# Patient Record
Sex: Male | Born: 1985 | Race: Black or African American | Hispanic: No | Marital: Single | Smoking: Current some day smoker
Health system: Southern US, Community
[De-identification: ages and names within clinical notes are randomized; demographics above are authoritative.]

## PROBLEM LIST (undated history)

## (undated) HISTORY — PX: OTHER SURGICAL HISTORY: SHX169

---

## 2005-03-29 ENCOUNTER — Emergency Department: Payer: Self-pay | Admitting: Emergency Medicine
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
## 2006-04-27 ENCOUNTER — Emergency Department: Payer: Self-pay | Admitting: Emergency Medicine

## 2006-11-11 IMAGING — CR LEFT MIDDLE FINGER 2+V
1 series · 3 of 3 positions shown · non-contrast
Comparison: none

REASON FOR EXAM: crush injury   Minor Care # 3
COMMENTS:  LMP: (Male)

PROCEDURE:     DXR - DXR FINGER MID 3RD DIGIT LT HAND  - April 27, 2006  [DATE]
RESULT:     Views of the LEFT third finger reveal a fracture through the mid
shaft of the distal phalanx. There is angulation at the fracture site.
There is disruption of the overlying soft tissues.

[Series 1: view not recorded · 0.17mm/px · 3 of 3 slices shown]
[im 1/3]
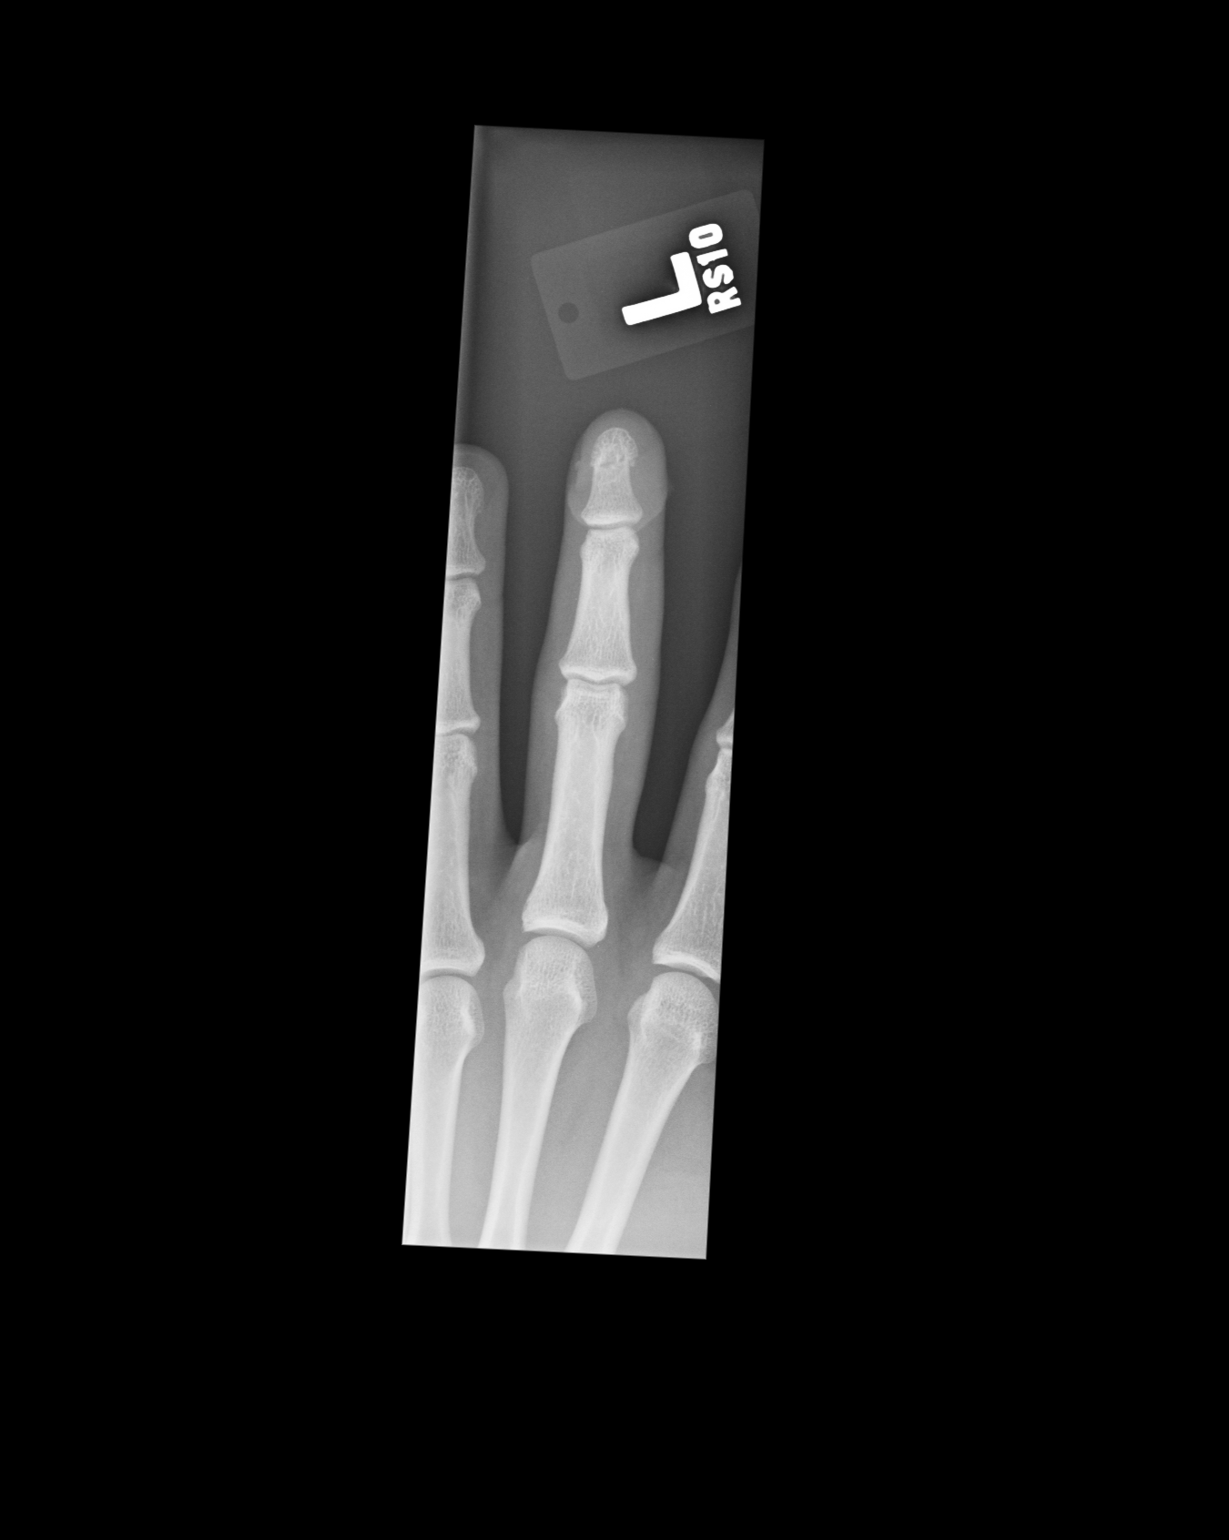
[im 2/3]
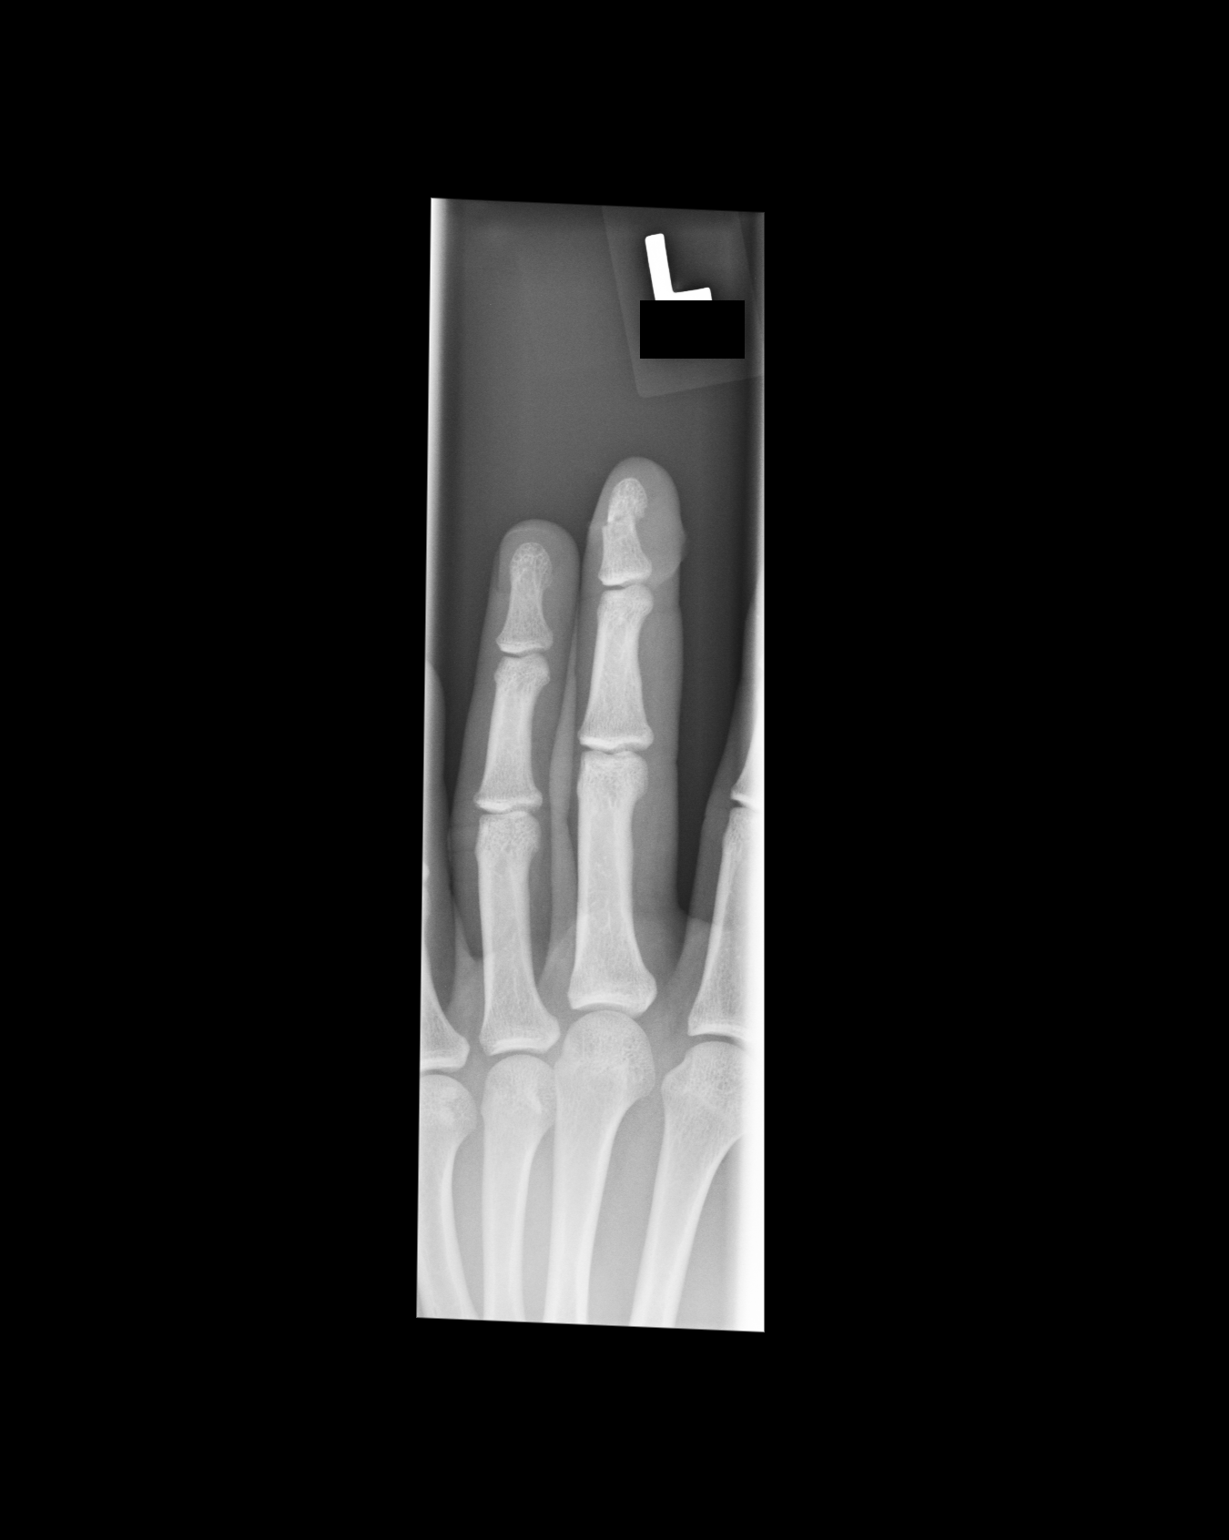
[im 3/3]
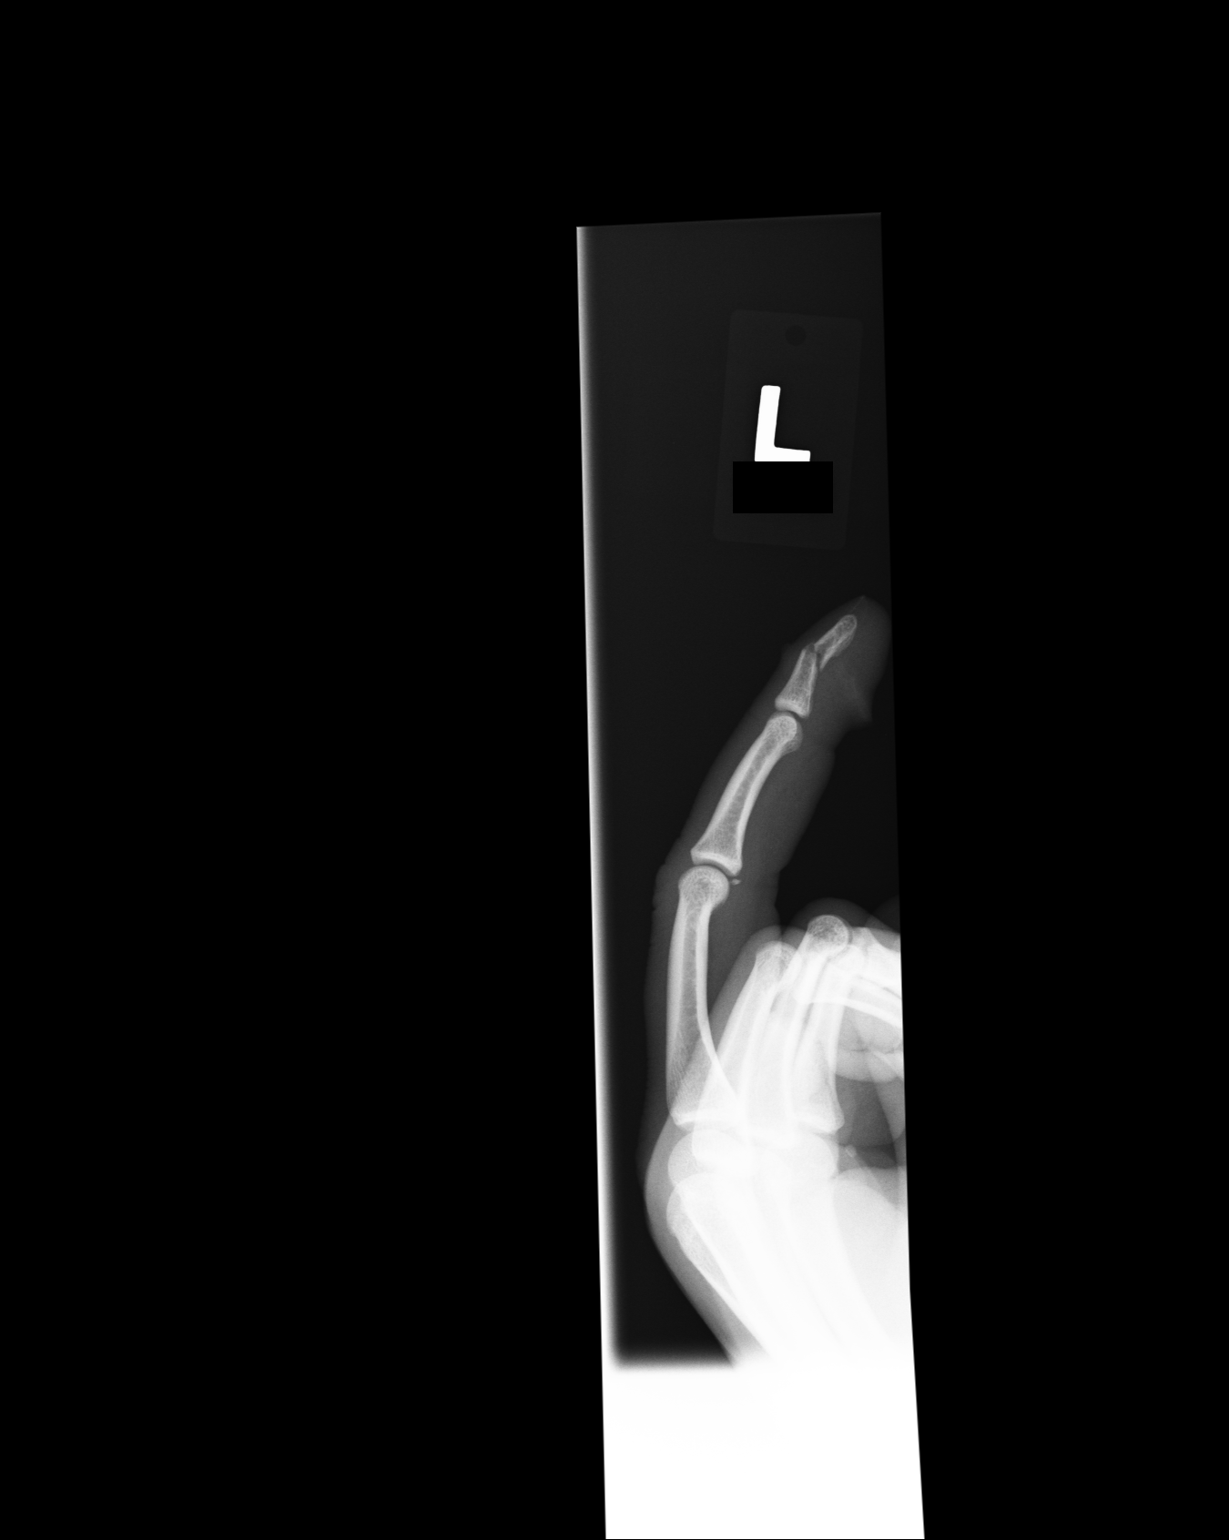

[3 of 3 positions shown; findings below may reference images not displayed]

IMPRESSION: 1)The patient has sustained a fracture through the mid shaft of the distal
phalanx of the LEFT third or long finger.

## 2016-08-27 ENCOUNTER — Ambulatory Visit (INDEPENDENT_AMBULATORY_CARE_PROVIDER_SITE_OTHER): Payer: Self-pay | Admitting: Podiatry

## 2016-08-27 DIAGNOSIS — L603 Nail dystrophy: Secondary | ICD-10-CM

## 2016-08-27 DIAGNOSIS — B351 Tinea unguium: Secondary | ICD-10-CM

## 2016-08-27 NOTE — Progress Notes (Addendum)
   Subjective:    Patient ID: Kevin Estes, male    DOB: [birth year], 30 y.o.   MRN: [id number]  HPI  30 year old male presents the also concerns of fungus to all of his toenails this been ongoing for quite some time. He is tried over-the-counter treatment any relief. He denies any pain toenails but as they do get long they cause pain in shoe gear. Denies any redness or drainage coming from the toenails. No other complaints today.  Review of Systems  All other systems reviewed and are negative.      Objective:   Physical Exam General: AAO x3, NAD  Dermatological: Nails are hypertrophic, dystrophic, brittle, discolored, elongated 10. No surrounding redness or drainage. No tenderness to the toenails.  Try, peeling, erythematous skin bilateral feet. No open lesions or pre-ulcerative lesions are identified today.  Vascular: Dorsalis Pedis artery and Posterior Tibial artery pedal pulses are 2/4 bilateral with immedate capillary fill time. Pedal hair growth present. No varicosities and no lower extremity edema present bilateral. There is no pain with calf compression, swelling, warmth, erythema.   Neruologic: Grossly intact via light touch bilateral. Vibratory intact via tuning fork bilateral. Protective threshold with Semmes Wienstein monofilament intact to all pedal sites bilateral.   Musculoskeletal: No gross boney pedal deformities bilateral. No pain, crepitus, or limitation noted with foot and ankle range of motion bilateral. Muscular strength 5/5 in all groups tested bilateral.  Gait: Unassisted, Nonantalgic.      Assessment & Plan:  30 year old male with onychodystrophy likely onychomycosis; tinea pedis -Treatment options discussed including all alternatives, risks, and complications -Etiology of symptoms were discussed -Nails were debrided and [facility] -We will go ahead and start Lamisil given the skin infection as well as the nail fungus. Also him back  after nail cultures in 4 weeks. If onychomycosis and we decided to proceed with Lamisil we will continue this and check blood work due to long term use.  -Discussed treatment options and he will likely undergo Lamisil pending culture results. -Follow-up of the culture sooner if any. Call any questions concerns.  Ovid CurdMatthew Temperance Kelemen, DPM

## 2016-08-27 NOTE — Patient Instructions (Signed)

## 2016-08-31 MED ORDER — TERBINAFINE HCL 250 MG PO TABS: 250.0000 mg | ORAL_TABLET | Freq: Every day | ORAL | 0 refills | Status: DC

## 2016-09-01 ENCOUNTER — Telehealth: Payer: Self-pay | Admitting: *Deleted

## 2016-09-01 NOTE — Telephone Encounter (Signed)
Called and left the patient a message stating that I called the lamisil in to the [facility] on [address] in [place] and to call me back if he did not receive the medicine. Misty StanleyLisa

## 2016-09-24 ENCOUNTER — Ambulatory Visit (INDEPENDENT_AMBULATORY_CARE_PROVIDER_SITE_OTHER): Payer: Self-pay | Admitting: Podiatry

## 2016-09-24 ENCOUNTER — Ambulatory Visit: Payer: Self-pay | Admitting: Podiatry

## 2016-09-24 DIAGNOSIS — B351 Tinea unguium: Secondary | ICD-10-CM

## 2016-09-24 DIAGNOSIS — Z79899 Other long term (current) drug therapy: Secondary | ICD-10-CM

## 2016-09-24 MED ORDER — TERBINAFINE HCL 250 MG PO TABS: 250.0000 mg | ORAL_TABLET | Freq: Every day | ORAL | 0 refills | Status: DC

## 2016-09-24 NOTE — Patient Instructions (Signed)

## 2016-09-25 ENCOUNTER — Telehealth: Payer: Self-pay | Admitting: *Deleted

## 2016-09-25 LAB — CBC WITH DIFFERENTIAL/PLATELET
BASOS ABS: 0 10*3/uL (ref 0.0–0.2)
Basos: 0 %
EOS (ABSOLUTE): 0.1 10*3/uL (ref 0.0–0.4)
Eos: 2 %
Hematocrit: 41 % (ref 37.5–51.0)
Hemoglobin: 13.8 g/dL (ref 12.6–17.7)
IMMATURE GRANS (ABS): 0 10*3/uL (ref 0.0–0.1)
IMMATURE GRANULOCYTES: 0 %
LYMPHS: 41 %
Lymphocytes Absolute: 2.1 10*3/uL (ref 0.7–3.1)
MCH: 28.6 pg (ref 26.6–33.0)
MCHC: 33.7 g/dL (ref 31.5–35.7)
MCV: 85 fL (ref 79–97)
MONOCYTES: 12 %
Monocytes Absolute: 0.6 10*3/uL (ref 0.1–0.9)
NEUTROS ABS: 2.3 10*3/uL (ref 1.4–7.0)
NEUTROS PCT: 45 %
PLATELETS: 238 10*3/uL (ref 150–379)
RBC: 4.83 x10E6/uL (ref 4.14–5.80)
RDW: 13.7 % (ref 12.3–15.4)
WBC: 5.2 10*3/uL (ref 3.4–10.8)

## 2016-09-25 LAB — HEPATIC FUNCTION PANEL
ALBUMIN: 4.3 g/dL (ref 3.5–5.5)
ALT: 35 IU/L (ref 0–44)
AST: 32 IU/L (ref 0–40)
Alkaline Phosphatase: 67 IU/L (ref 39–117)
BILIRUBIN, DIRECT: 0.09 mg/dL (ref 0.00–0.40)
Bilirubin Total: 0.3 mg/dL (ref 0.0–1.2)
TOTAL PROTEIN: 7.2 g/dL (ref 6.0–8.5)

## 2016-09-25 NOTE — Telephone Encounter (Signed)
Pt states the terbinafine is not at the pharmacy. I told pt our office records show [facility]/24/[facility] at 11:36am. Pt states they just call and said the medication would be ready in 1 hour.

## 2016-09-29 ENCOUNTER — Ambulatory Visit: Payer: Self-pay

## 2016-09-29 DIAGNOSIS — B351 Tinea unguium: Secondary | ICD-10-CM

## 2016-09-29 NOTE — Progress Notes (Signed)
Pt presents with mycotic infection of nails 1-5 bilateral  All other systems are negative  Laser therapy administered to affected nails and tolerated well. All safety precautions were in place. Patient stated that he was currently taking lamisil and using a topical as well. Re-appointed in 1 month for 2nd of 6 treatments

## 2016-09-29 NOTE — Progress Notes (Signed)
Subjective: 30 year old male presents the also discussed nail culture results. He has been on Lamisil the last 4 weeks he denies any side effects. He states the skin is clearing up nicely since starting the medication. Denies any systemic complaints such as fevers, chills, nausea, vomiting. No acute changes since last appointment, and no other complaints at this time.   Objective: AAO x3, NAD DP/PT pulses palpable bilaterally, CRT less than 3 seconds Nails continue dystrophic, discolored, hypertrophic. There is no swelling redness or drainage or any tenderness the toenails. He pieces having quite a bit at tinea pedis and peeling of the skin which is resolved since starting the medication. Is no open lesions or pre-ulcerative lesions. No area pinpoint bony tenderness or pain the vibratory sensation. No pain with calf compression, swelling, warmth, erythema.  Assessment: 30 year old male onychomycosis   Plan: -Treatment options discussed including all alternatives, risks, and complications -Etiology of symptoms were discussed -Nail culture results were discussed the patient which should reveal onychomycosis. Discussed with him continuing Lamisil for another 2 months and this is refilled today as well as repeat blood work ordered. Also ordered compound cream for nail fungus through [facility].  -He would also like to proceed with lasering of the nails. Follow-up in the [place] office with lasering.   Ovid CurdMatthew Wagoner, DPM

## 2016-10-01 ENCOUNTER — Telehealth: Payer: Self-pay | Admitting: *Deleted

## 2016-10-01 NOTE — Telephone Encounter (Addendum)
-----   Message from Vivi BarrackMatthew R Wagoner, DPM sent at 09/26/2016  4:39 PM EDT ----- Blood work normal- can continue lamisil. 10/01/2016-Informed pt of Dr. Gabriel RungWagoner's orders.

## 2016-10-27 ENCOUNTER — Ambulatory Visit: Payer: Self-pay | Admitting: Podiatry

## 2016-10-27 DIAGNOSIS — B351 Tinea unguium: Secondary | ICD-10-CM

## 2016-10-27 NOTE — Telephone Encounter (Signed)
Error

## 2016-10-27 NOTE — Progress Notes (Signed)
Pt presents with mycotic infection of nails 1-5 bilateral  All other systems are negative  Laser therapy administered to affected nails and tolerated well. All safety precautions were in place. Patient stated that he was currently taking lamisil and using a topical as well. Re-appointed in 1 month for 3 of 6 treatments

## 2016-12-01 ENCOUNTER — Other Ambulatory Visit: Payer: Self-pay

## 2016-12-03 ENCOUNTER — Ambulatory Visit (INDEPENDENT_AMBULATORY_CARE_PROVIDER_SITE_OTHER): Payer: Self-pay

## 2016-12-03 DIAGNOSIS — B351 Tinea unguium: Secondary | ICD-10-CM

## 2016-12-03 DIAGNOSIS — L603 Nail dystrophy: Secondary | ICD-10-CM

## 2016-12-28 NOTE — Progress Notes (Signed)
Pt presents with mycotic infection of nails 1-5 bilateral  All other systems are negative  Laser therapy administered to affected nails and tolerated well. All safety precautions were in place. Patient stated that he was currently taking lamisil and using a topical as well. Re-appointed in 1 month for 4 of 6 treatments

## 2016-12-31 ENCOUNTER — Encounter: Payer: Self-pay | Admitting: Podiatry

## 2016-12-31 ENCOUNTER — Ambulatory Visit (INDEPENDENT_AMBULATORY_CARE_PROVIDER_SITE_OTHER): Payer: Self-pay | Admitting: Podiatry

## 2016-12-31 DIAGNOSIS — B351 Tinea unguium: Secondary | ICD-10-CM

## 2016-12-31 DIAGNOSIS — Z79899 Other long term (current) drug therapy: Secondary | ICD-10-CM

## 2016-12-31 MED ORDER — TERBINAFINE HCL 250 MG PO TABS: 250.0000 mg | ORAL_TABLET | Freq: Every day | ORAL | 1 refills | Status: DC

## 2016-12-31 NOTE — Progress Notes (Signed)
He presents today for follow-up of his onychomycosis. He states that this looks so much better than it did previously he's very excited. He is here for his laser today but also wanted to see me.  Objective: Vital signs are stable he is alert and oriented 3 denies any changes in his past medical history medications allergies are social history medication regimen. States that he had no problems taking the Lamisil.  Toenails appear to be healing quite nicely. The approximate 50% grown out.  Assessment: Long-term therapy associated with onychomycosis.  Plan: I recommended we continue the laser these nails as well as continue the Lamisil therapy 250 mg tablets 1 a day for the next 2 months. Follow-up with me in 3 months.

## 2017-01-27 ENCOUNTER — Other Ambulatory Visit: Payer: Self-pay

## 2017-02-09 ENCOUNTER — Ambulatory Visit: Payer: Self-pay

## 2017-02-09 DIAGNOSIS — B351 Tinea unguium: Secondary | ICD-10-CM

## 2017-02-23 ENCOUNTER — Ambulatory Visit (INDEPENDENT_AMBULATORY_CARE_PROVIDER_SITE_OTHER): Payer: Self-pay | Admitting: Podiatry

## 2017-02-23 DIAGNOSIS — B351 Tinea unguium: Secondary | ICD-10-CM

## 2017-02-24 NOTE — Progress Notes (Signed)
Pt presents with mycotic infection of nails 1-5 bilateral  All other systems are negative  Laser therapy administered to affected nails and tolerated well. All safety precautions were in place. Patient stated that he was currently taking lamisil and using a topical as well. Re-appointed in 1 month for 5 of 6 treatments

## 2017-02-25 NOTE — Progress Notes (Signed)
Pt presents with mycotic infection of nails 1-5 bilateral  All other systems are negative  Laser therapy administered to affected nails and tolerated well. All safety precautions were in place. Patient stated that he was currently taking lamisil and using a topical as well. Re-appointed in 2 months for 7th and final treatment

## 2017-04-29 ENCOUNTER — Ambulatory Visit: Payer: Self-pay

## 2017-04-29 DIAGNOSIS — B351 Tinea unguium: Secondary | ICD-10-CM

## 2017-05-11 NOTE — Progress Notes (Signed)
Pt presents with mycotic infection of nails 1-5 bilateral, cleared at 95%  All other systems are negative  Laser therapy administered to affected nails and tolerated well. All safety precautions were in place. Patient stated that he was currently taking lamisil and using a topical as well. Re-appointed prn

## 2017-09-14 ENCOUNTER — Ambulatory Visit (INDEPENDENT_AMBULATORY_CARE_PROVIDER_SITE_OTHER): Payer: Self-pay | Admitting: Urology

## 2017-09-14 ENCOUNTER — Encounter: Payer: Self-pay | Admitting: Urology

## 2017-09-14 VITALS — BP 125/67 | HR 120 | Ht 68.0 in | Wt 226.0 lb

## 2017-09-14 DIAGNOSIS — Z3009 Encounter for other general counseling and advice on contraception: Secondary | ICD-10-CM

## 2017-09-14 DIAGNOSIS — E281 Androgen excess: Secondary | ICD-10-CM

## 2017-09-14 MED ORDER — DIAZEPAM 10 MG PO TABS: 10.0000 mg | ORAL_TABLET | Freq: Once | ORAL | 0 refills | Status: AC

## 2017-09-14 NOTE — Progress Notes (Signed)
09/14/2017 3:27 PM   Kevin Estes 07/29/1986 [id number]  Referring provider: No referring provider defined for this encounter.  Chief Complaint  Patient presents with  . VAS Consult    New Patient    HPI: 31 year old male who presents today for further evaluation of possible vasectomy.  He is accompanied today to the office by his wife and daughter.  He was states that he has 4 biological children and desires no further pregnancies.  He and his wife was discussed this in detail.  No history of testicular injury or scrotal pain.  He does report today that he uses exogenous testosterone injections which he self manages through a gym.  He is a Pharmacist, communitybody builder.  He has not had labs recently.  He reports that he has a bodybuilding competition in 4 weeks and would like to defer vasectomy until after this time.   PMH: No past medical history on file.  Surgical History: Past Surgical History:  Procedure Laterality Date  . none      Home Medications:  Allergies as of 09/14/2017   No Known Allergies     Medication List       Accurate as of 09/14/17 11:59 PM. Always use your most recent med list.          diazepam 10 MG tablet Commonly known as:  VALIUM Take 1 tablet (10 mg total) by mouth once. Take 1 hour prior to procedure. Please have a driver available.   LIOTRIX (T3-T4) PO Take by mouth.   testosterone cypionate 100 MG/ML injection Commonly known as:  DEPOTESTOTERONE CYPIONATE Inject 100 mg into the muscle every other day. For IM use only       Allergies: No Known Allergies  Family History: Family History  Problem Relation Age of Onset  . Hypertension Father   . Prostate cancer Neg Hx   . Bladder Cancer Neg Hx     Social History:  reports that he has been smoking Cigarettes.  He has never used smokeless tobacco. He reports that he drinks alcohol. He reports that he does not use drugs.  ROS: UROLOGY Frequent Urination?: No Hard to postpone  urination?: No Burning/pain with urination?: No Get up at night to urinate?: No Leakage of urine?: No Urine stream starts and stops?: No Trouble starting stream?: No Do you have to strain to urinate?: No Blood in urine?: No Urinary tract infection?: No Sexually transmitted disease?: No Injury to kidneys or bladder?: No Painful intercourse?: No Weak stream?: No Erection problems?: No Penile pain?: No  Gastrointestinal Nausea?: No Vomiting?: No Indigestion/heartburn?: No Diarrhea?: No Constipation?: No  Constitutional Fever: No Night sweats?: No Weight loss?: No Fatigue?: No  Skin Skin rash/lesions?: No Itching?: No  Eyes Blurred vision?: No Double vision?: No  Ears/Nose/Throat Sore throat?: No Sinus problems?: No  Hematologic/Lymphatic Swollen glands?: No Easy bruising?: No  Cardiovascular Leg swelling?: No Chest pain?: No  Respiratory Cough?: No Shortness of breath?: No  Endocrine Excessive thirst?: No  Musculoskeletal Back pain?: No Joint pain?: No  Neurological Headaches?: No Dizziness?: No  Psychologic Depression?: No Anxiety?: No  Physical Exam: BP 125/67   Pulse (!) 120   Ht 5\' 8"  (1.727 m)   Wt 226 lb (102.5 kg)   BMI 34.36 kg/m   Constitutional:  Alert and oriented, No acute distress.  Accompanied by wife and 31-year-old daughter.  Extremely muscular. HEENT: [place] AT, moist mucus membranes.  Trachea midline, no masses. Cardiovascular: No clubbing, cyanosis, or edema. Respiratory:  Normal respiratory effort, no increased work of breathing. GI: Abdomen is soft, nontender, nondistended, no abdominal masses GU: Normal circumcised phallus.  Testicles descended bilaterally, vasa easily palpable. Skin: No rashes, bruises or suspicious lesions. Neurologic: Grossly intact, no focal deficits, moving all 4 extremities. Psychiatric: Normal mood and affect.  Laboratory Data: Lab Results  Component Value Date   WBC 5.2 09/24/2016   HGB 13.8  09/24/2016   HCT 41.0 09/24/2016   MCV 85 09/24/2016   PLT 238 09/24/2016    Assessment & Plan:    1. Screening and evaluation for vasectomy Today, we discussed what the vas deferens is, where it is located, and its function. We reviewed the procedure for vasectomy, it's risks, benefits, alternatives, and likelihood of achieving his goals. We discussed in detail the procedure, complications, and recovery as well as the need for clearance prior to unprotected intercourse. We discussed that vasectomy does not protect against sexually transmitted diseases. We discussed that this procedure does not result in immediate sterility and that they would need to use other forms of birth control until he has been cleared with negative postvasectomy semen analyses. I explained that the procedure is considered to be permanent and that attempts at reversal have varying degrees of success. These options include vasectomy reversal, sperm retrieval, and in vitro fertilization; these can be very expensive. We discussed the chance of postvasectomy pain syndrome which occurs in less than 5% of patients. I explained to the patient that there is no treatment to resolve this chronic pain, and that if it developed I would not be able to help resolve the issue, but that surgery is generally not needed for correction. I explained there have even been reports of systemic like illness associated with this chronic pain, and that there was no good cure. I explained that vasectomy it is not a 100% reliable form of birth control, and the risk of pregnancy after vasectomy is approximately 1 in 2000 men who had a negative postvasectomy semen analysis or rare non-motile sperm. I explained that repeat vasectomy was necessary in less than 1% of vasectomy procedures when employing the type of technique that I use. I explained that he should refrain from ejaculation for approximately one week following vasectomy. I explained that there are other  options for birth control which are permanent and non-permanent; we discussed these. I explained the rates of surgical complications, such as symptomatic hematoma or infection, are low (1-2%) and vary with the surgeon's experience and criteria used to diagnose the complication.  The patient had the opportunity to ask questions to his stated satisfaction. He voiced understanding of the above factors and stated that he has read all the information provided to him and the packets and informed consent  He is an excellent candidate for office-based vasectomy.  Prescription for Valium 10 mg given today,  Advised to take 1 hour prior to the procedure.  He will have a ride available on that day.  Consent was signed in anticipation of the procedure.  2. Androgen excess Lengthy and earnest conversation today about anabolic steroid abuse. Consequences of abuse were discussed today including risk of cardiovascular event/thrombocytosis, amongst others Strongly advised patient to be under the care of a physician for further management of this   Return for Schedule vasectomy.  Vanna Scotland, MD  [facility] [address], [address] [place], [place] [address] [phone]

## 2017-10-22 ENCOUNTER — Encounter: Payer: Self-pay | Admitting: Urology

## 2021-06-27 ENCOUNTER — Ambulatory Visit: Payer: Self-pay | Admitting: Physician Assistant

## 2021-06-27 ENCOUNTER — Other Ambulatory Visit: Payer: Self-pay

## 2021-06-27 DIAGNOSIS — Z113 Encounter for screening for infections with a predominantly sexual mode of transmission: Secondary | ICD-10-CM

## 2021-06-27 DIAGNOSIS — N341 Nonspecific urethritis: Secondary | ICD-10-CM

## 2021-06-27 LAB — HM HIV SCREENING LAB: HM HIV Screening: NEGATIVE

## 2021-06-27 LAB — GRAM STAIN

## 2021-06-28 ENCOUNTER — Encounter: Payer: Self-pay | Admitting: Physician Assistant

## 2021-06-28 MED ORDER — DOXYCYCLINE HYCLATE 100 MG PO TABS: 100.0000 mg | ORAL_TABLET | Freq: Two times a day (BID) | ORAL | 0 refills | Status: AC

## 2021-06-28 NOTE — Progress Notes (Signed)
[facility] Department STI clinic/screening visit  Subjective:  Kevin Estes is a 35 y.o. male being seen today for an STI screening visit. The patient reports they do have symptoms.    Patient has the following medical conditions:  There are no problems to display for this patient.    Chief Complaint  Patient presents with   SEXUALLY TRANSMITTED DISEASE    screening    HPI  Patient reports that he has had irritation at the head of his penis and clear discharge for about 3 days.  Denies other symptoms,  chronic conditions, surgeries and regular medicines.  Reports that he has not previously had a HIV test and last void prior to sample collection for Gram stain was over 2 hr ago.    See flowsheet for further details and programmatic requirements.    The following portions of the patient's history were reviewed and updated as appropriate: allergies, current medications, past medical history, past social history, past surgical history and problem list.  Objective:  There were no vitals filed for this visit.  Physical Exam Constitutional:      General: He is not in acute distress.    Appearance: Normal appearance.  HENT:     Head: Normocephalic and atraumatic.     Comments: No nits,lice, or hair loss. No cervical, supraclavicular or axillary adenopathy.     Mouth/Throat:     Mouth: Mucous membranes are moist.     Pharynx: Oropharynx is clear. No oropharyngeal exudate or posterior oropharyngeal erythema.  Eyes:     Conjunctiva/sclera: Conjunctivae normal.  Pulmonary:     Effort: Pulmonary effort is normal.  Abdominal:     Palpations: Abdomen is soft. There is no mass.     Tenderness: There is no abdominal tenderness. There is no guarding or rebound.  Genitourinary:    Penis: Normal.      Testes: Normal.     Comments: Pubic area without nits, lice, hair loss, edema, erythema, lesions and inguinal adenopathy. Penis circumcised without rash or  lesions. Small amount of clear discharge at meatus. Testicles descended bilaterally,nt, no masses or edema.  Musculoskeletal:     Cervical back: Neck supple. No tenderness.  Skin:    General: Skin is warm and dry.     Findings: No bruising, erythema, lesion or rash.  Neurological:     Mental Status: He is alert and oriented to person, place, and time.  Psychiatric:        Mood and Affect: Mood normal.        Behavior: Behavior normal.        Thought Content: Thought content normal.        Judgment: Judgment normal.      Assessment and Plan:  Kevin Estes is a 35 y.o. male presenting to the [facility] Department for STI screening  1. Screening for STD (sexually transmitted disease) Patient into clinic without symptoms. Reviewed with patient Gram stain results.  Rec condoms with all sex. Await test results.  Counseled that RN will call if needs to RTC for treatment once results are back.  - Gram stain - Gonococcus culture - HIV [facility] - Syphilis Serology,  Lab  2. NGU (nongonococcal urethritis) Treat NGU with Doxycycline 100 mg #14 1 po BID for 7 days. No sex for 14 days and until after partner completes treatment. Call with questions or concerns.  - doxycycline (VIBRA-TABS) 100 MG tablet; Take 1 tablet (100 mg total)  by mouth 2 (two) times daily for 7 days.  Dispense: 14 tablet; Refill: 0     No follow-ups on file.  No future appointments.  Matt Holmes, PA

## 2021-07-03 LAB — GONOCOCCUS CULTURE
